# Patient Record
Sex: Male | Born: 1970 | Race: White | Hispanic: No | Marital: Single | Smoking: Current every day smoker
Health system: Southern US, Community
[De-identification: ages and names within clinical notes are randomized; demographics above are authoritative.]

---

## 2007-01-07 ENCOUNTER — Ambulatory Visit (HOSPITAL_COMMUNITY): Admission: RE | Admit: 2007-01-07 | Discharge: 2007-01-07 | Payer: Self-pay | Admitting: Family Medicine

## 2021-09-12 NOTE — Progress Notes (Signed)
[facility] [address], [place] [address]   CLINIC:  Medical Oncology/Hematology  CONSULT NOTE  Patient Care Team: [employer], MD as PCP - General (Internal Medicine)  CHIEF COMPLAINTS/PURPOSE OF CONSULTATION:  Leukocytosis  HISTORY OF PRESENTING ILLNESS:  Curtis Kelly 50 y.o. male is here at the request of his PCP (Dr. Gerarda Fraction) due to leukocytosis.  We unfortunately have very little in the way of previous labs on this patient, but lab report sent by PCP (08/13/2021) show mild leukocytosis with WBC 13.4 and differential showing elevated neutrophils 7.8, elevated lymphocytes 4.1, and elevated monocytes 1.0.  He reports that he has previously been told that he is elevated white blood count.  Patient does admit to recent COVID-19 infection, onset of symptoms was 08/20/2021 with positive test on 08/23/2021.  However, this occurred after his CBC was drawn on 08/13/2021.  He does admit to steroids with Medrol Dosepak started on 08/25/2021, which occurred after the aforementioned CBC was drawn on 08/13/2021.  He admits to smoking 1 pack/day of cigarettes. He has no history of autoimmune or connective tissue diseases. He has no history of splenectomy. He denies any B symptoms such as fever, chills, night sweats, unintentional weight loss (except while he was acutely ill with COVID-19 infection).  He has not noticed any new lumps or bumps. He denies any left upper quadrant pain, nausea, or early satiety.    His PMH is otherwise positive for chronic foot pain, GERD, anxiety, hyperlipidemia, and osteoarthritis.  His social history is significant for smoking 1 PPD of cigarettes.  He does drink 1 beer after work approximately 4 days/week.  He denies any illicit drug use.  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  His family history is positive for nephew with leukemia and maternal grandfather  with lung cancer.   MEDICAL HISTORY: Chronic pain syndrome GERD Anxiety Hyperlipidemia Osteoarthritis Chronic foot pain  SURGICAL HISTORY: Right foot surgery  SOCIAL HISTORY: His social history is significant for smoking 1 PPD of cigarettes.  He does drink 1 beer after work approximately 4 days/week.  He denies any illicit drug use.  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  FAMILY HISTORY: His family history is positive for nephew with leukemia and maternal grandfather with lung cancer.  ALLERGIES:  No known drug allergies  MEDICATIONS: Zithromax Z-Pak 250 mg tablets x5 days (08/25/2021) Medrol Dosepak (08/25/2021) Hydrocodone 10 mg-acetaminophen 325 mg tablet 4 x daily as needed for pain Pantoprazole 40 mg tablet twice daily Atorvastatin 40 mg tablet daily Aspirin 81 mg tablet daily   REVIEW OF SYSTEMS:  Review of Systems  Constitutional:  Negative for appetite change, chills, diaphoresis, fatigue, fever and unexpected weight change.  HENT:   Negative for lump/mass and nosebleeds.   Eyes:  Negative for eye problems.  Respiratory:  Positive for shortness of breath (with exertion). Negative for cough and hemoptysis.   Cardiovascular:  Negative for chest pain, leg swelling and palpitations.  Gastrointestinal:  Negative for abdominal pain, blood in stool, constipation, diarrhea, nausea and vomiting.  Genitourinary:  Negative for hematuria.   Skin: Negative.   Neurological:  Negative for dizziness, headaches and light-headedness.  Hematological:  Does not bruise/bleed easily.     PHYSICAL EXAMINATION:  ECOG PERFORMANCE STATUS: 0 - Asymptomatic  There were no vitals filed for this visit. There  were no vitals filed for this visit.  Physical Exam Constitutional:      Appearance: Normal appearance.  HENT:     Head: Normocephalic and atraumatic.     Mouth/Throat:     Mouth: Mucous membranes are  moist.  Eyes:     Extraocular Movements: Extraocular movements intact.     Pupils: Pupils are equal, round, and reactive to light.  Cardiovascular:     Rate and Rhythm: Normal rate and regular rhythm.     Pulses: Normal pulses.     Heart sounds: Normal heart sounds.  Pulmonary:     Effort: Pulmonary effort is normal.     Breath sounds: Normal breath sounds.  Abdominal:     General: Bowel sounds are normal.     Palpations: Abdomen is soft.     Tenderness: There is no abdominal tenderness.  Musculoskeletal:        General: No swelling.     Right lower leg: No edema.     Left lower leg: No edema.  Lymphadenopathy:     Cervical: No cervical adenopathy.  Skin:    General: Skin is warm and dry.  Neurological:     General: No focal deficit present.     Mental Status: He is alert and oriented to person, place, and time.  Psychiatric:        Mood and Affect: Mood is anxious.        Behavior: Behavior normal.      ASSESSMENT & PLAN: 1.  Leukocytosis - Seen at the request of primary care provider (Dr. Gerarda Fraction) - Labs sent by PCP (08/13/2021) show WBC 13.4, neutrophils 7.8, lymphocytes 4.1, and monocytes 1.0 - He admits to recent infection with COVID-19, onset of symptoms 08/20/2021, positive test on 08/23/2021 - His only recent steroids were on 08/25/2021 (Medrol Dosepak for COVID-19, started after CBC drawn on 08/13/2021) - He denies any history of autoimmune or connective tissue diseases.  No history of splenectomy. - He denies any B symptoms such as fever, chills, night sweats, unintentional weight loss (except for fever/chills during active COVID-19 infection).  No new lumps or bumps. - He admits to smoking 1 PPD cigarettes - No lymphadenopathy or hepatosplenomegaly detected on exam - CBC today (09/15/2021) shows normal WBC 8.0 - PLAN: Differential diagnosis favors benign intermittent leukocytosis, likely secondary to smoking.we will check labs indicative of underlying inflammation  including ESR, ANA, CRP, rheumatoid factor, LDH.  We will defer myeloproliferative work-up for the time being since his CBC has normalized (flow cytometry, BCR/ABL, and JAK2 testing has been canceled).  We will call patient for phone visit to discuss results.  If benign workup, will plan on follow-up in 6 months.  2.  Tobacco use - Patient smokes 1 PPD cigarettes - Smoking cessation was discussed during today's visit, we will continue to discuss at follow-up visit - Patient does not currently meet criteria for LDCT screening program due to his age less than 32 - PLAN: Continue to encourage smoking cessation  3.  Other history - PMH: chronic foot pain, GERD, anxiety, hyperlipidemia, and osteoarthritis. - SOCIAL: Patient smokes 1 PPD of cigarettes.  He does drink 1 beer after work approximately 4 days/week.  He denies any illicit drug use.  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 - FAMILY: Nephew with leukemia.  Maternal grandfather with lung cancer.   PLAN SUMMARY & DISPOSITION: -Labs today - Phone visit in 3  weeks to discuss results  All questions were answered. The patient knows to call the clinic with any problems, questions or concerns.   Medical decision making: Low  Time spent on visit: I spent 30 minutes counseling the patient face to face. The total time spent in the appointment was 45 minutes and more than 50% was on counseling.  I, Tarri Abernethy PA-C, have seen this patient in conjunction with Dr. Derek Jack. Greater than 50% of visit was performed by Dr. Delton Coombes.    Harriett Rush, PA-C 09/15/2021 9:47 AM  DR. Aashna Matson: Patient evaluated at the request of Dr. Gerarda Fraction for leukocytosis.  I have independently evaluated this patient face-to-face and agree with HPI written by Casey Burkitt, PA-C.  He reportedly had leukocytosis in August and September, predominantly  neutrophils, monocytes and lymphocytes.  He is also 1 pack/day smoker.  He does not have any B symptoms.  We will repeat CBC and if it is elevated, will consider further testing for myeloproliferative disorders.  Otherwise we will follow-up with repeat CBC in 6 months.

## 2021-09-15 ENCOUNTER — Inpatient Hospital Stay (HOSPITAL_COMMUNITY): Payer: 59 | Attending: Hematology | Admitting: Hematology

## 2021-09-15 ENCOUNTER — Encounter (HOSPITAL_COMMUNITY): Payer: Self-pay | Admitting: Hematology

## 2021-09-15 ENCOUNTER — Inpatient Hospital Stay (HOSPITAL_COMMUNITY): Payer: 59

## 2021-09-15 ENCOUNTER — Other Ambulatory Visit: Payer: Self-pay

## 2021-09-15 VITALS — BP 149/73 | HR 83 | Temp 96.8°F | Resp 17 | Ht 70.0 in | Wt 207.7 lb

## 2021-09-15 DIAGNOSIS — D72820 Lymphocytosis (symptomatic): Secondary | ICD-10-CM

## 2021-09-15 DIAGNOSIS — D72821 Monocytosis (symptomatic): Secondary | ICD-10-CM

## 2021-09-15 DIAGNOSIS — D72825 Bandemia: Secondary | ICD-10-CM

## 2021-09-15 DIAGNOSIS — F17210 Nicotine dependence, cigarettes, uncomplicated: Secondary | ICD-10-CM | POA: Diagnosis not present

## 2021-09-15 DIAGNOSIS — F419 Anxiety disorder, unspecified: Secondary | ICD-10-CM | POA: Diagnosis not present

## 2021-09-15 DIAGNOSIS — Z8616 Personal history of COVID-19: Secondary | ICD-10-CM | POA: Insufficient documentation

## 2021-09-15 DIAGNOSIS — D72829 Elevated white blood cell count, unspecified: Secondary | ICD-10-CM | POA: Diagnosis not present

## 2021-09-15 DIAGNOSIS — M1990 Unspecified osteoarthritis, unspecified site: Secondary | ICD-10-CM | POA: Diagnosis not present

## 2021-09-15 DIAGNOSIS — E785 Hyperlipidemia, unspecified: Secondary | ICD-10-CM | POA: Insufficient documentation

## 2021-09-15 DIAGNOSIS — K219 Gastro-esophageal reflux disease without esophagitis: Secondary | ICD-10-CM | POA: Diagnosis not present

## 2021-09-15 DIAGNOSIS — Z801 Family history of malignant neoplasm of trachea, bronchus and lung: Secondary | ICD-10-CM | POA: Insufficient documentation

## 2021-09-15 DIAGNOSIS — G894 Chronic pain syndrome: Secondary | ICD-10-CM | POA: Insufficient documentation

## 2021-09-15 LAB — CBC WITH DIFFERENTIAL/PLATELET
Abs Immature Granulocytes: 0.01 10*3/uL (ref 0.00–0.07)
Basophils Absolute: 0.1 10*3/uL (ref 0.0–0.1)
Basophils Relative: 1 %
Eosinophils Absolute: 0.1 10*3/uL (ref 0.0–0.5)
Eosinophils Relative: 2 %
HCT: 42.1 % (ref 39.0–52.0)
Hemoglobin: 14.3 g/dL (ref 13.0–17.0)
Immature Granulocytes: 0 %
Lymphocytes Relative: 46 %
Lymphs Abs: 3.7 10*3/uL (ref 0.7–4.0)
MCH: 31.1 pg (ref 26.0–34.0)
MCHC: 34 g/dL (ref 30.0–36.0)
MCV: 91.5 fL (ref 80.0–100.0)
Monocytes Absolute: 0.9 10*3/uL (ref 0.1–1.0)
Monocytes Relative: 11 %
Neutro Abs: 3.2 10*3/uL (ref 1.7–7.7)
Neutrophils Relative %: 40 %
Platelets: 328 10*3/uL (ref 150–400)
RBC: 4.6 MIL/uL (ref 4.22–5.81)
RDW: 13.4 % (ref 11.5–15.5)
WBC: 8 10*3/uL (ref 4.0–10.5)
nRBC: 0 % (ref 0.0–0.2)

## 2021-09-15 LAB — LACTATE DEHYDROGENASE: LDH: 164 U/L (ref 98–192)

## 2021-09-15 LAB — SEDIMENTATION RATE: Sed Rate: 15 mm/hr (ref 0–16)

## 2021-09-15 LAB — C-REACTIVE PROTEIN: CRP: 1.7 mg/dL — ABNORMAL HIGH (ref <1.0)

## 2021-09-15 NOTE — Patient Instructions (Addendum)
[facility] at [facility] Discharge Instructions  You were seen today by Dr. Ellin Saba & Rojelio Brenner PA-C for your elevated white blood cell count.  As we have discussed, there are multiple things that can cause your white blood cell count to be elevated.  We suspect this is most likely related to inflammation from your tobacco use.  However, we will check several lab test today to rule out other causes, including some types of cancer.  (At this time, we do NOT have any major suspicion for cancer.)  LABS: Check labs today before leaving the hospital (main desk on the first floor)  OTHER TESTS: None at this time  MEDICATIONS: No changes to home medications  FOLLOW-UP APPOINTMENT: Phone visit in 3 weeks to discuss results of labs   Thank you for choosing [facility] at [facility] to provide your oncology and hematology care.  To afford each patient quality time with our provider, please arrive at least 15 minutes before your scheduled appointment time.   If you have a lab appointment with the Cancer Center please come in thru the Main Entrance and check in at the main information desk.  You need to re-schedule your appointment should you arrive 10 or more minutes late.  We strive to give you quality time with our providers, and arriving late affects you and other patients whose appointments are after yours.  Also, if you no show three or more times for appointments you may be dismissed from the clinic at the providers discretion.     Again, thank you for choosing [facility].  Our hope is that these requests will decrease the amount of time that you wait before being seen by our physicians.       _____________________________________________________________  Should you have questions after your visit to [facility], please contact our office at [phone] and follow the prompts.  Our office hours are 8:00 a.m.  and 4:30 p.m. Monday - Friday.  Please note that voicemails left after 4:00 p.m. may not be returned until the following business day.  We are closed weekends and major holidays.  You do have access to a nurse 24-7, just call the main number to the clinic [phone] and do not press any options, hold on the line and a nurse will answer the phone.    For prescription refill requests, have your pharmacy contact our office and allow 72 hours.    Due to Covid, you will need to wear a mask upon entering the hospital. If you do not have a mask, a mask will be given to you at the Main Entrance upon arrival. For doctor visits, patients may have 1 support person age 101 or older with them. For treatment visits, patients can not have anyone with them due to social distancing guidelines and our immunocompromised population.

## 2021-09-16 LAB — RHEUMATOID FACTOR: Rhuematoid fact SerPl-aCnc: 11 IU/mL (ref <14.0)

## 2021-09-16 LAB — ANA: Anti Nuclear Antibody (ANA): NEGATIVE

## 2021-09-17 ENCOUNTER — Telehealth (HOSPITAL_COMMUNITY): Payer: Self-pay | Admitting: Physician Assistant

## 2021-09-17 DIAGNOSIS — D72825 Bandemia: Secondary | ICD-10-CM

## 2021-09-17 DIAGNOSIS — D72821 Monocytosis (symptomatic): Secondary | ICD-10-CM

## 2021-09-17 DIAGNOSIS — D72820 Lymphocytosis (symptomatic): Secondary | ICD-10-CM

## 2021-09-17 NOTE — Telephone Encounter (Signed)
I called and spoke with the patient's wife today regarding his recent lab results.  Explained to her that since his white blood cell count had returned to normal, we canceled his MPN work-up.  Explained that he most likely has intermittent reactive leukocytosis due to smoking (note elevated CRP 1.7).  We will cancel his telephone visit in November, and instead we will have him repeat labs and follow-up in 6 months.

## 2021-09-23 LAB — CALR + JAK2 E12-15 + MPL (REFLEXED)

## 2021-10-07 ENCOUNTER — Telehealth (HOSPITAL_COMMUNITY): Payer: 59 | Admitting: Physician Assistant

## 2022-03-12 ENCOUNTER — Other Ambulatory Visit (HOSPITAL_COMMUNITY): Payer: 59

## 2022-03-19 ENCOUNTER — Ambulatory Visit (HOSPITAL_COMMUNITY): Payer: 59 | Admitting: Physician Assistant
